# Patient Record
Sex: Female | Born: 1950 | Race: White | Hispanic: No | Marital: Married | Smoking: Never smoker
Health system: Southern US, Community
[De-identification: ages and names within clinical notes are randomized; demographics above are authoritative.]

---

## 2001-05-28 ENCOUNTER — Other Ambulatory Visit: Admission: RE | Admit: 2001-05-28 | Discharge: 2001-05-28 | Payer: Self-pay | Admitting: Obstetrics and Gynecology

## 2006-04-10 ENCOUNTER — Ambulatory Visit: Payer: Self-pay | Admitting: Internal Medicine

## 2006-04-10 ENCOUNTER — Inpatient Hospital Stay (HOSPITAL_COMMUNITY): Admission: EM | Admit: 2006-04-10 | Discharge: 2006-04-25 | Payer: Self-pay | Admitting: Emergency Medicine

## 2006-04-13 ENCOUNTER — Ambulatory Visit: Payer: Self-pay | Admitting: Thoracic Surgery

## 2006-04-20 ENCOUNTER — Ambulatory Visit: Payer: Self-pay | Admitting: Infectious Diseases

## 2006-05-03 ENCOUNTER — Ambulatory Visit: Payer: Self-pay | Admitting: Thoracic Surgery

## 2008-01-22 IMAGING — CR DG CHEST 1V PORT
1 series · 1 of 1 positions shown · non-contrast
Comparison: 04/16/06.

CLINICAL DATA: Dyspnea/hypoxia/follow-up pneumonia. 
 PORTABLE CHEST ? 1 VIEW:

[view not recorded]
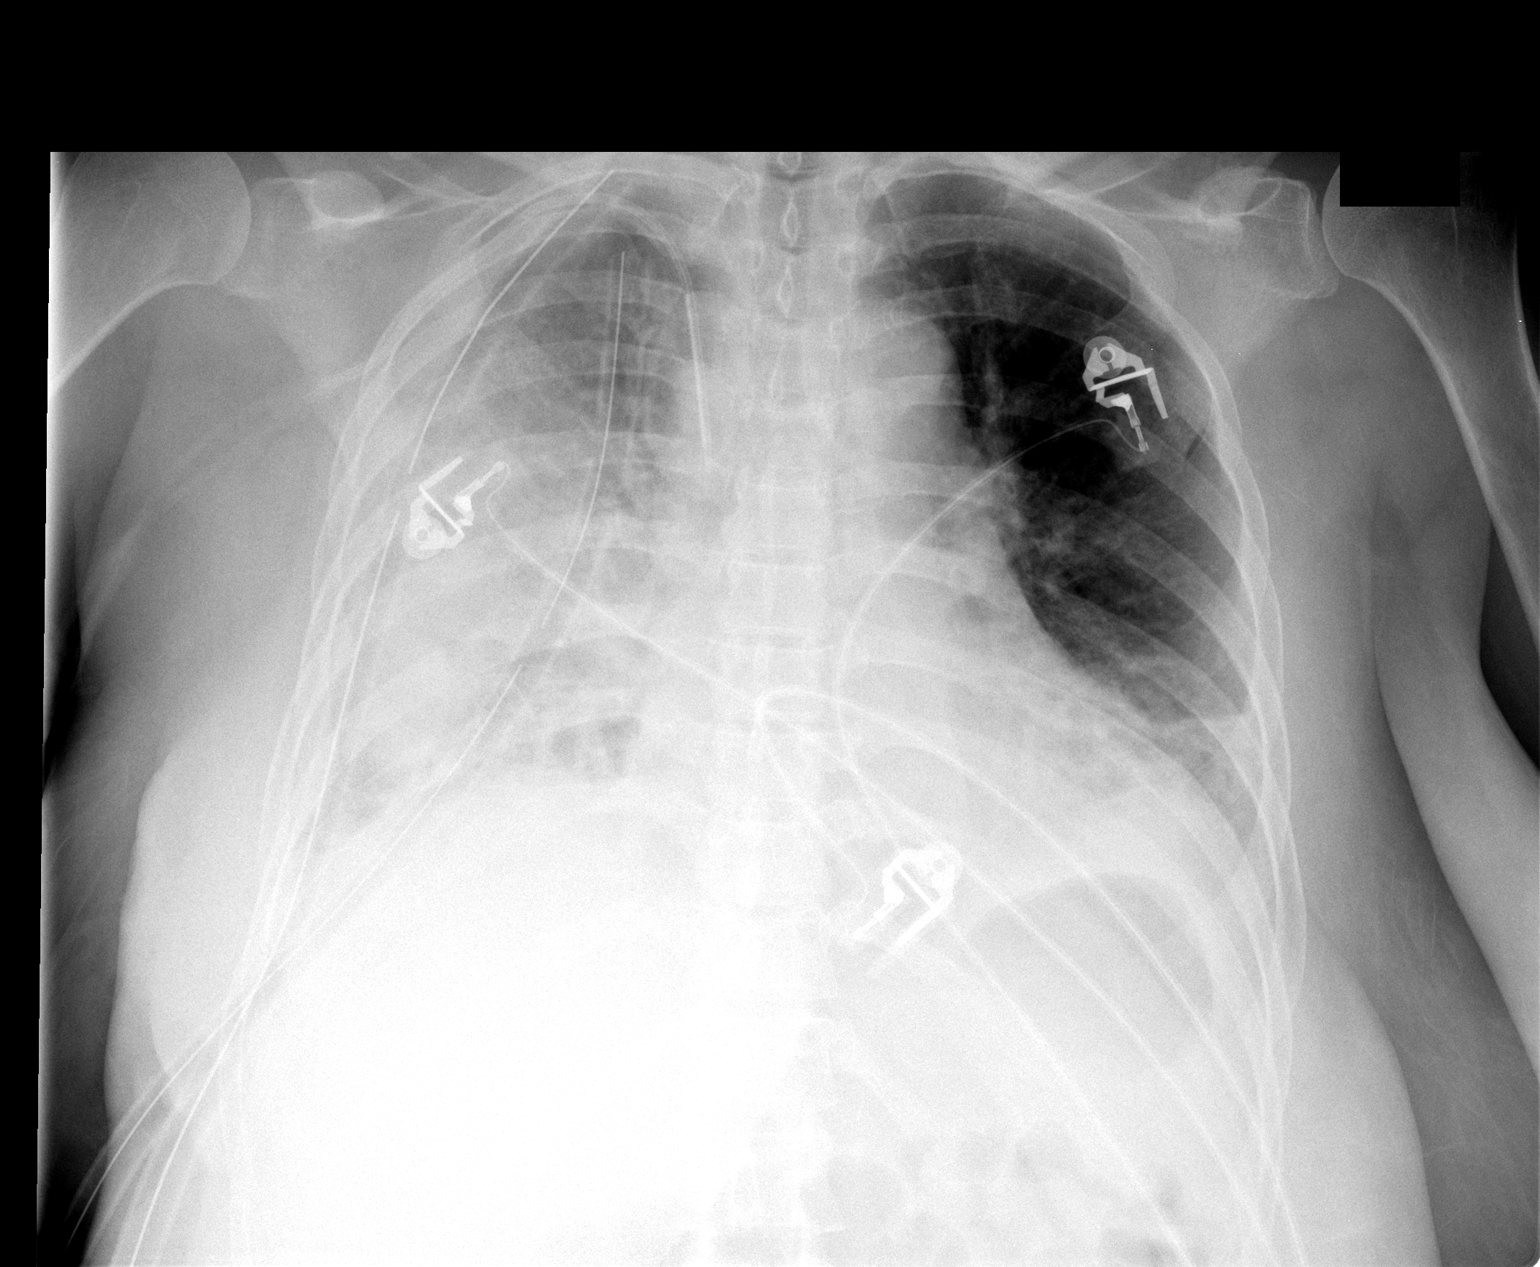

[1 of 1 positions shown; findings below may reference images not displayed]

FINDINGS: Interval worsening of bilateral air space disease.  There is relative sparing of the left upper lobe.  The right chest tubes remain in place as well as a central line with its tip in the right innominate vein.
IMPRESSION: Interval worsening of bilateral air space disease.

## 2008-01-23 IMAGING — CR DG CHEST 1V PORT
1 series · 1 of 1 positions shown · non-contrast
Comparison: 04/17/06.

CLINICAL DATA: Dyspnea. Hypoxia. Follow-up pneumonia. Status-post chest tube drainage with pleural effusion. 
 PORTABLE CHEST - 1 VIEW:

[view not recorded]
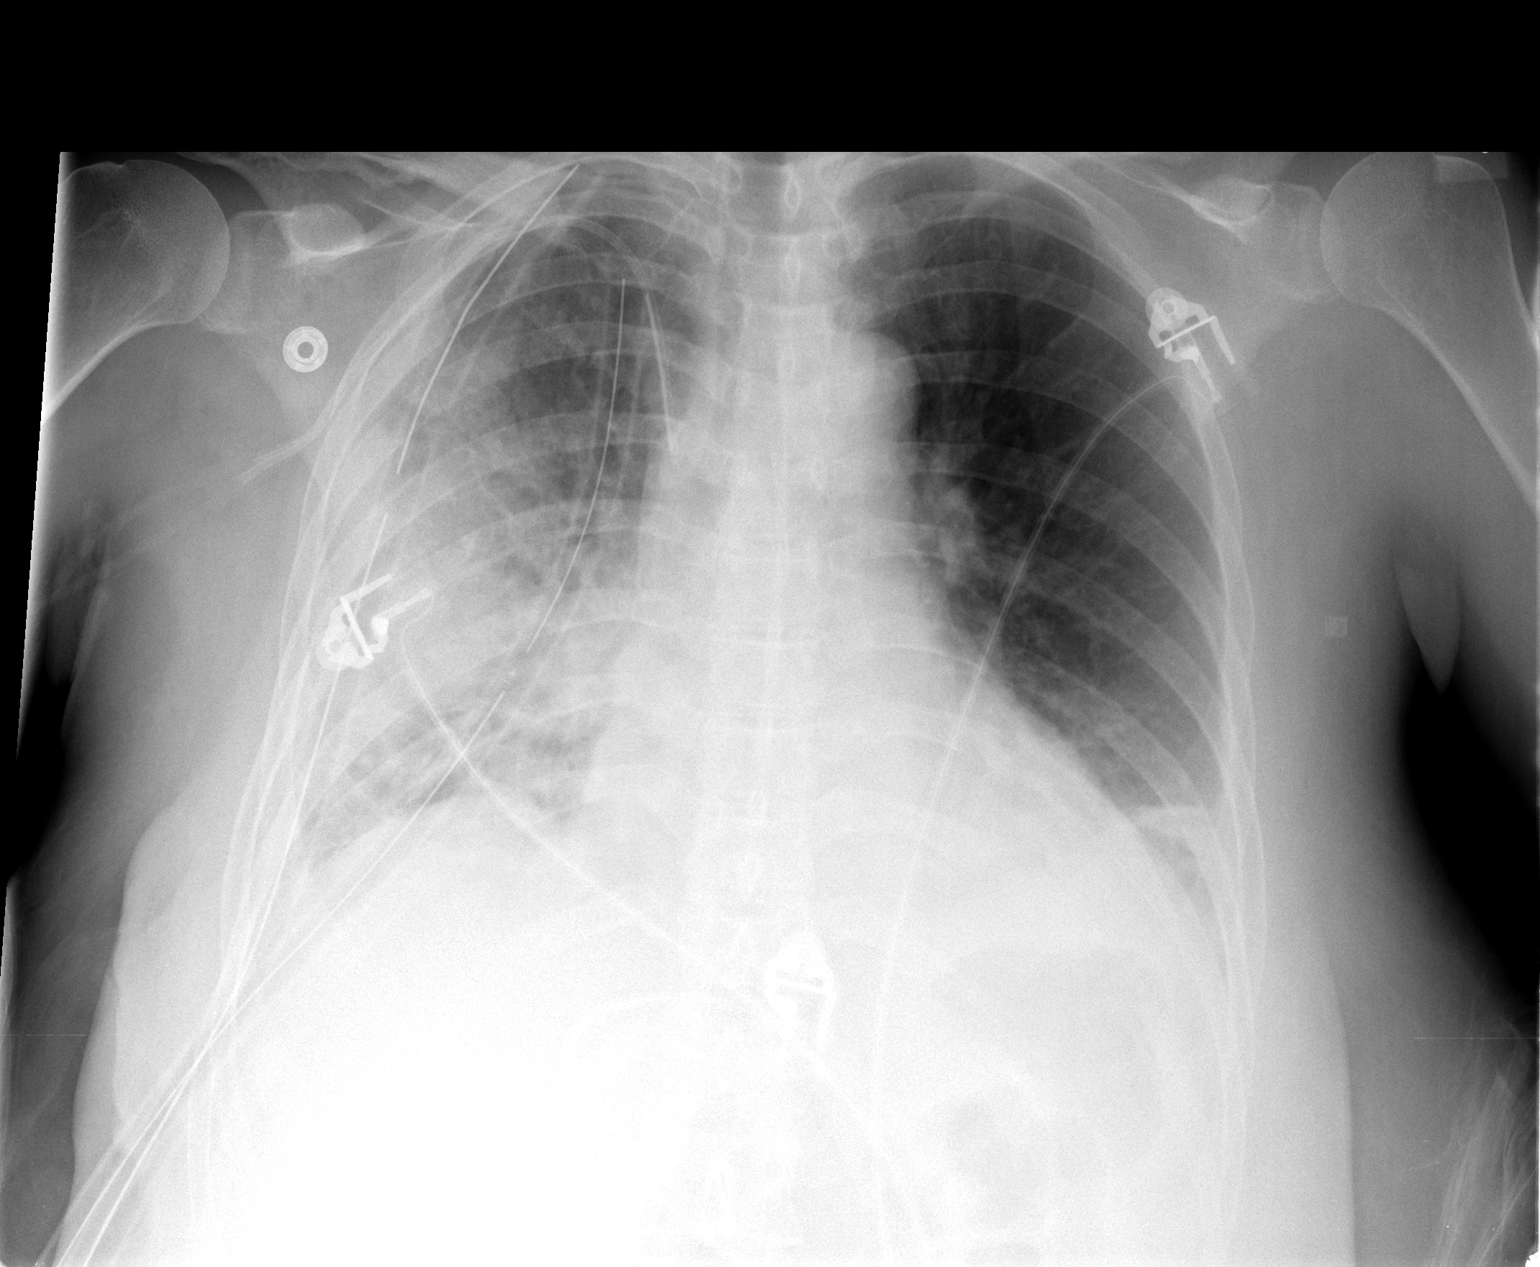

[1 of 1 positions shown; findings below may reference images not displayed]

FINDINGS: Two right chest tubes remain in place and there is no evidence of pneumothorax. 
 Diffuse right lung airspace disease is unchanged as well as mild infiltrate in the left lung base. Mild cardiomegaly is stable.
IMPRESSION: No significant change compared with prior study.

## 2008-01-24 IMAGING — CR DG CHEST 1V PORT
1 series · 1 of 1 positions shown · non-contrast
Comparison: 04/18/06.

CLINICAL DATA: Pneumonia, hypoxia, tachycardia.  Pneumothorax.  
PORTABLE CHEST - 1 VIEW 04/19/06:

[view not recorded]
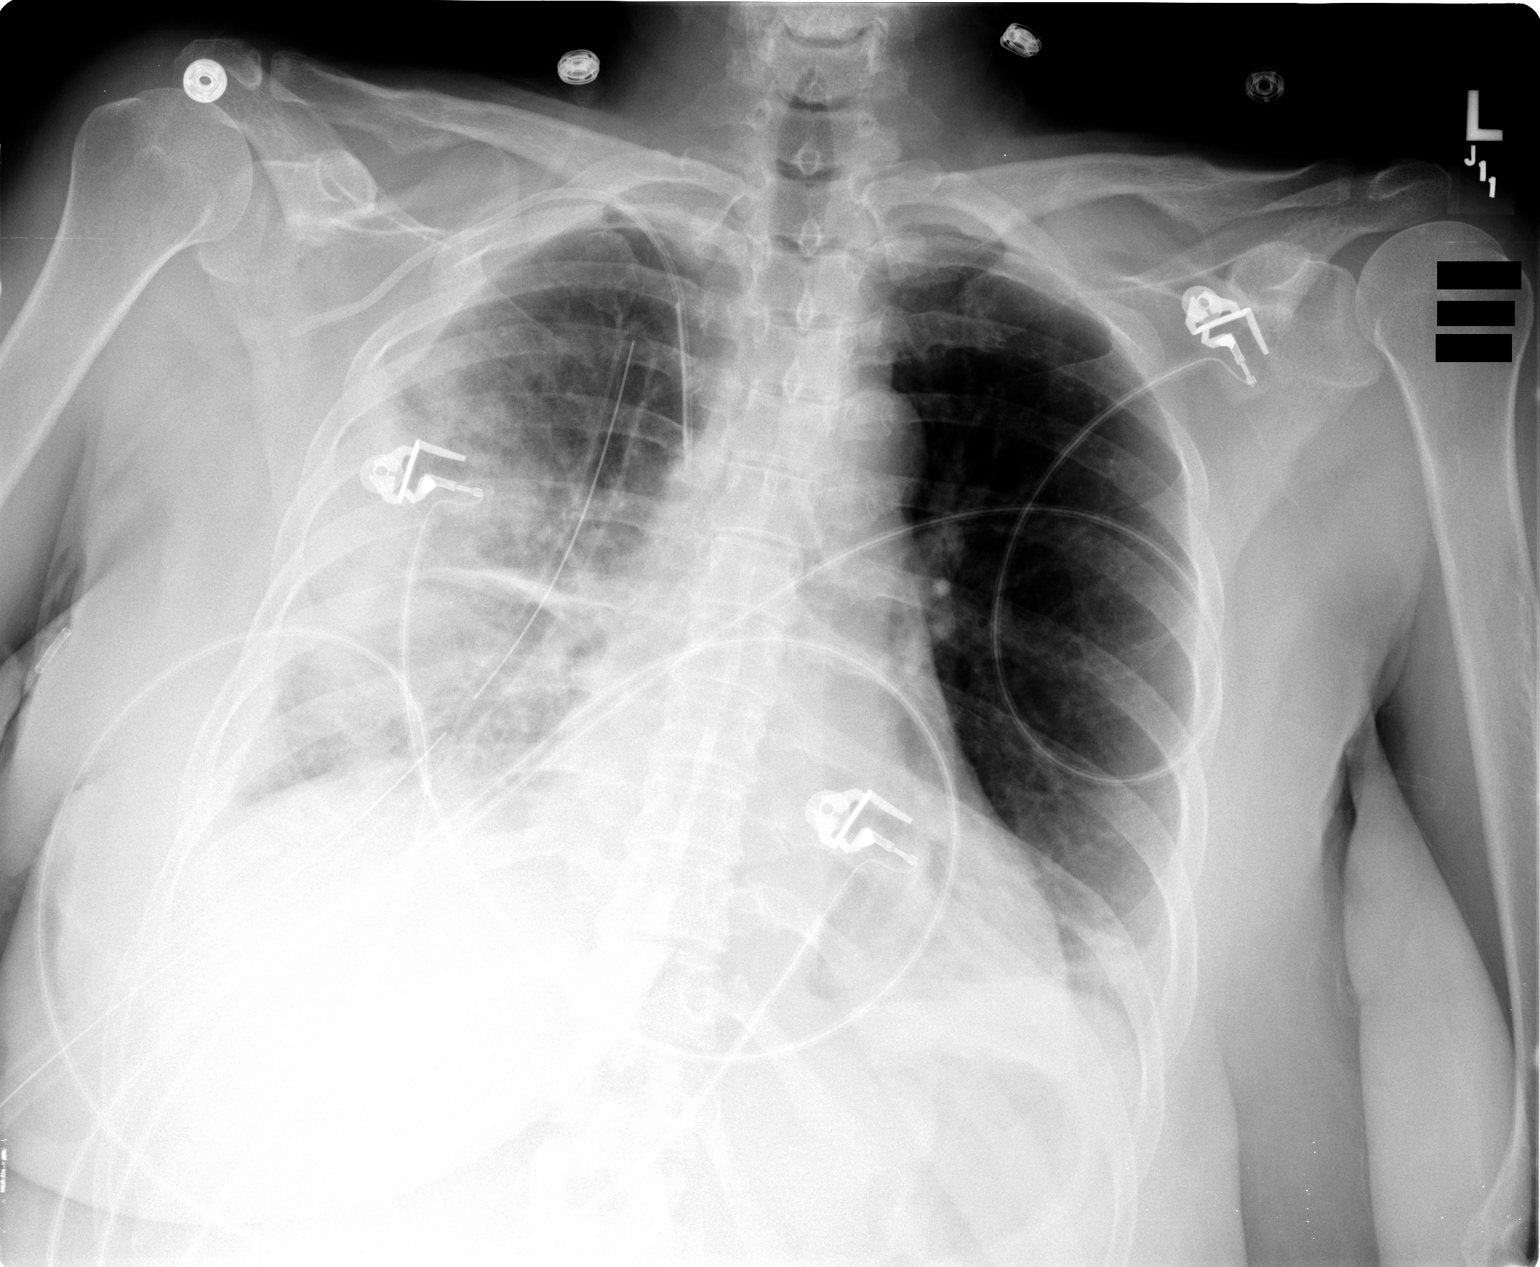

[1 of 1 positions shown; findings below may reference images not displayed]

FINDINGS: One of two right chest tubes has been removed in the interval.  No definite pleural air.  Right lung and left lower lobe airspace disease persists.  There is slight improvement in aeration in the right upper lobe.
IMPRESSION: 1.  No definite pneumothorax after removal of one of two right chest tubes.  
2.  Persistent right lung and left lower lobe airspace disease.  Right pleural effusion.

## 2008-01-25 IMAGING — US US RENAL
1 series · 14 of 25 positions shown · non-contrast
Comparison: none

CLINICAL DATA: Infection/inflammation.  Pneumonia.
 RENAL/URINARY TRACT ULTRASOUND:
TECHNIQUE: Complete ultrasound examination of the urinary tract was performed including evaluation of the kidneys, renal collecting systems, and urinary bladder.

[Series 1: unknown · 0.32mm/px · 14 of 30 slices shown]
[im 1/30]
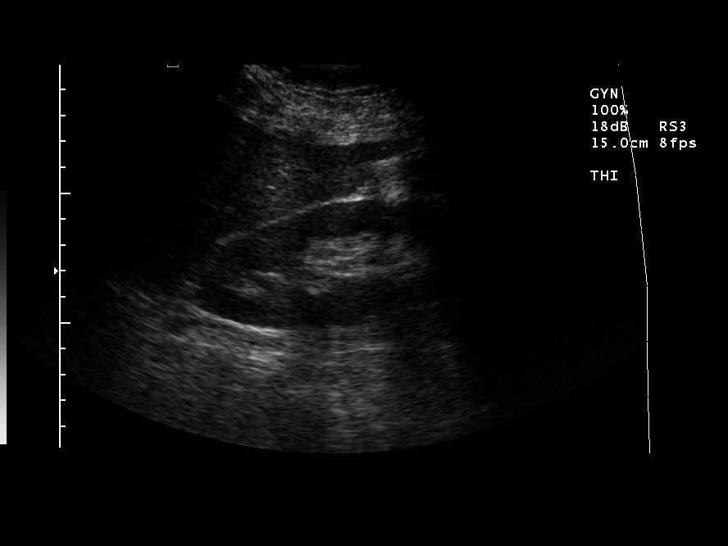
[im 3/30]
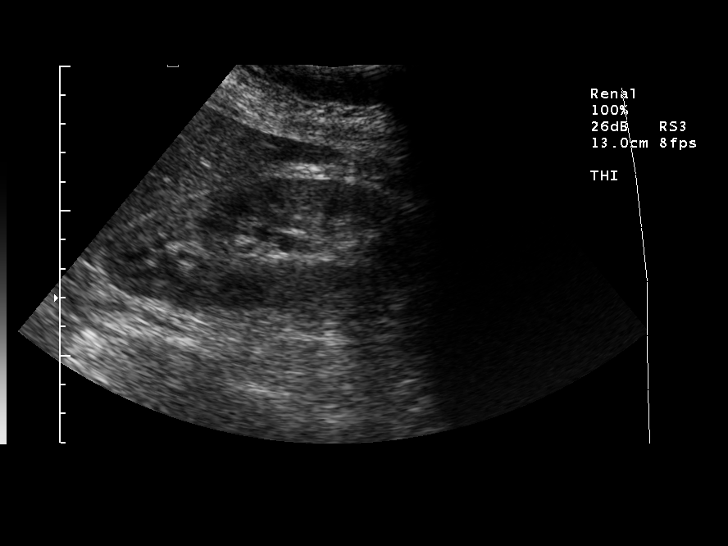
[im 5/30]
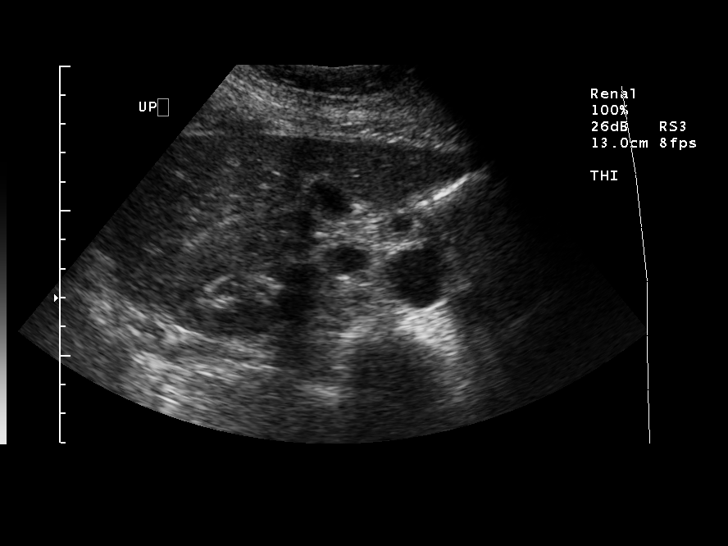
[im 8/30]
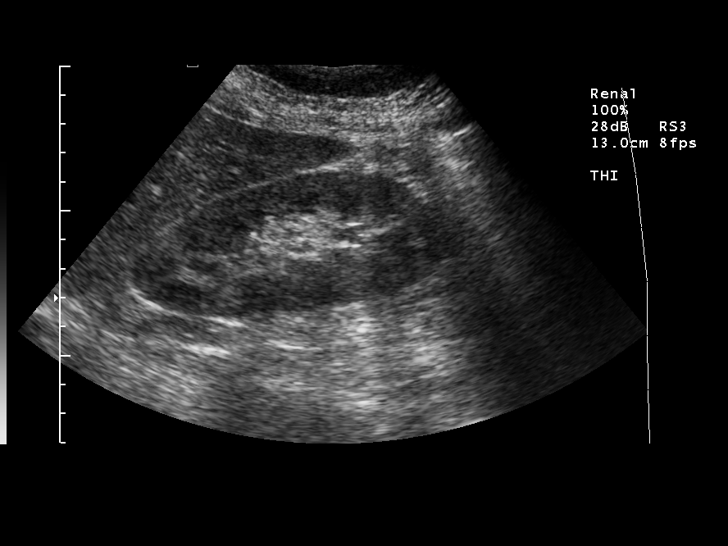
[im 10/30]
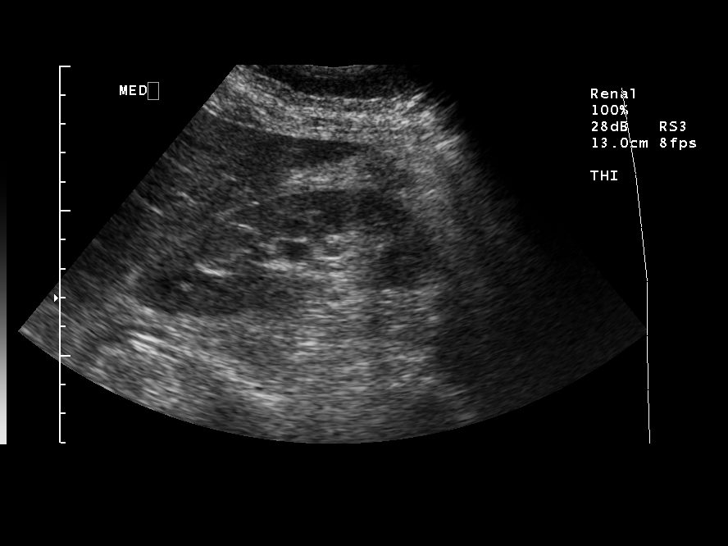
[im 11/30]
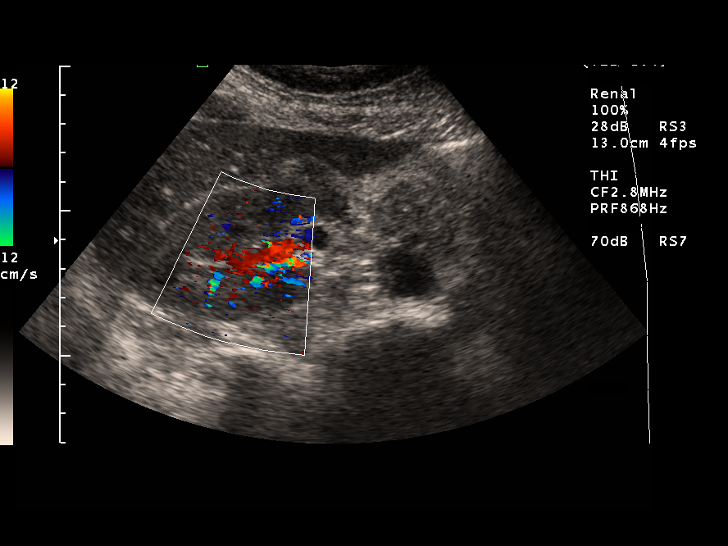
[im 14/30]
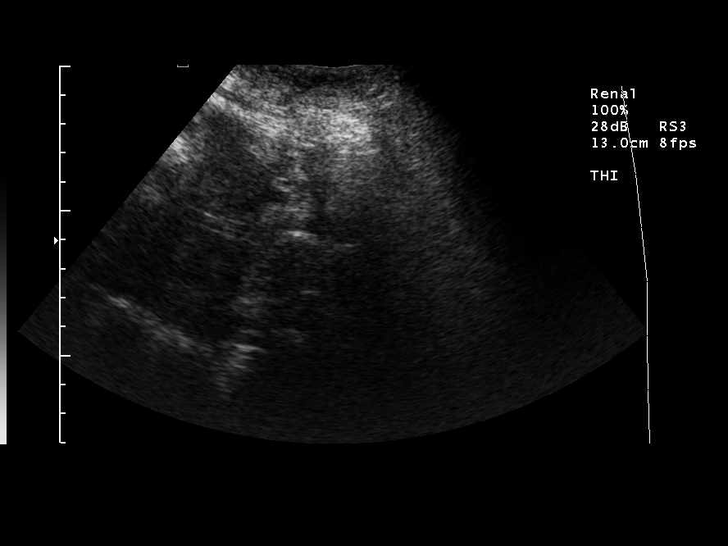
[im 16/30]
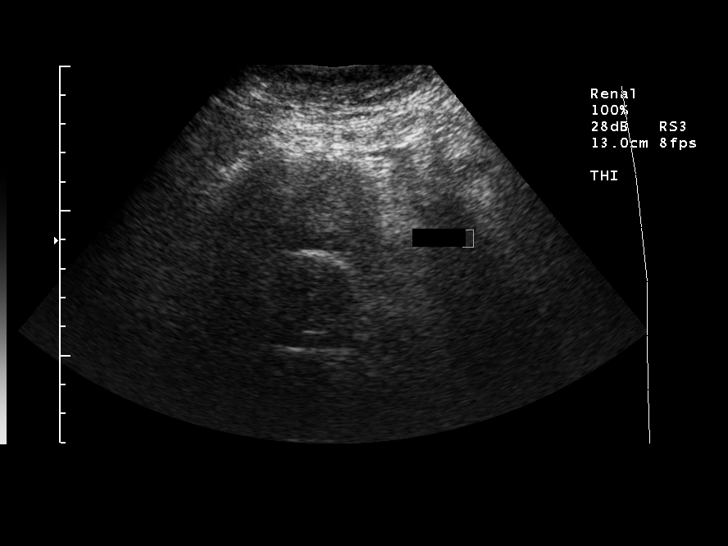
[im 19/30]
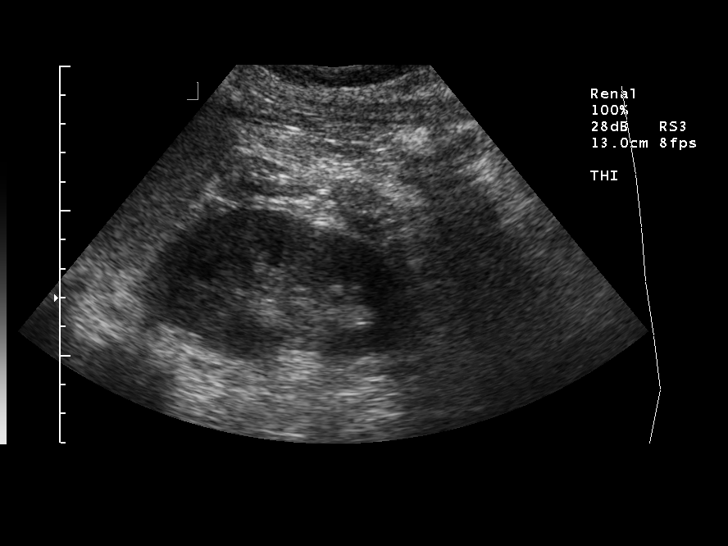
[im 20/30]
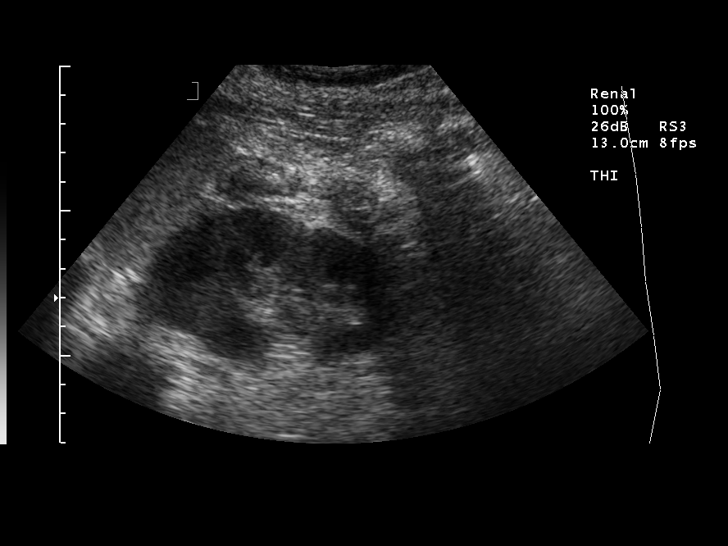
[im 22/30]
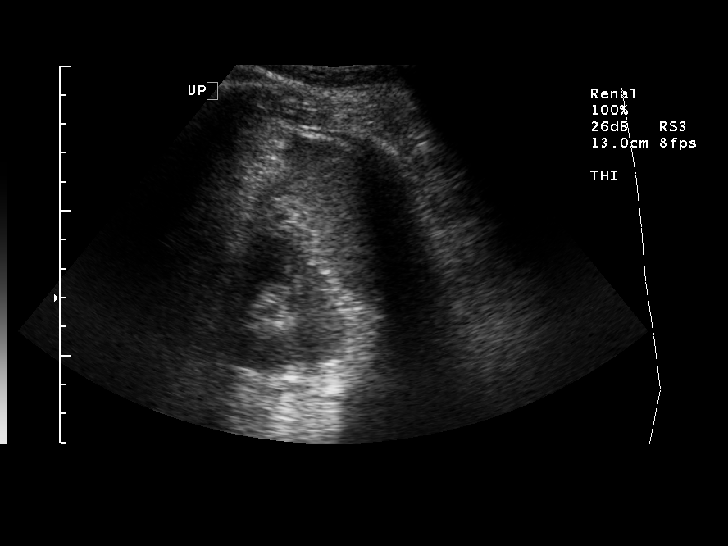
[im 25/30]
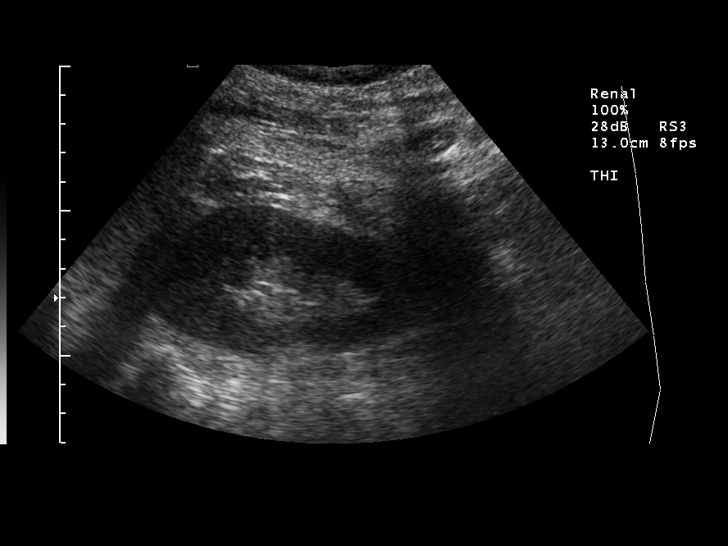
[im 27/30]
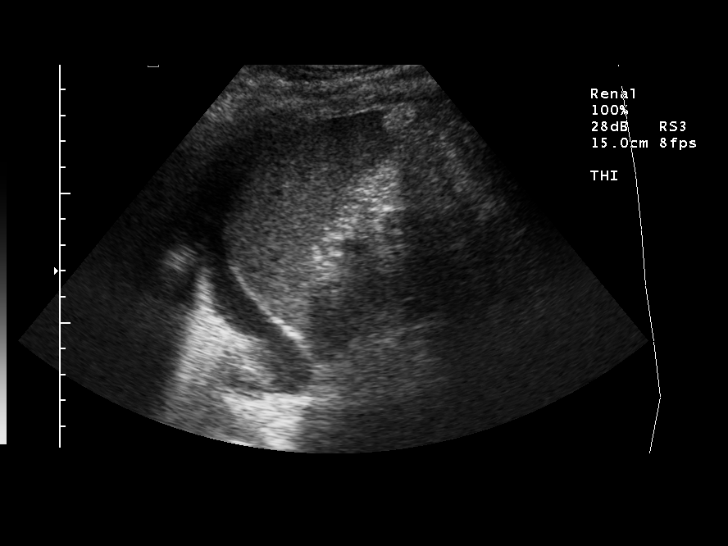
[im 30/30]
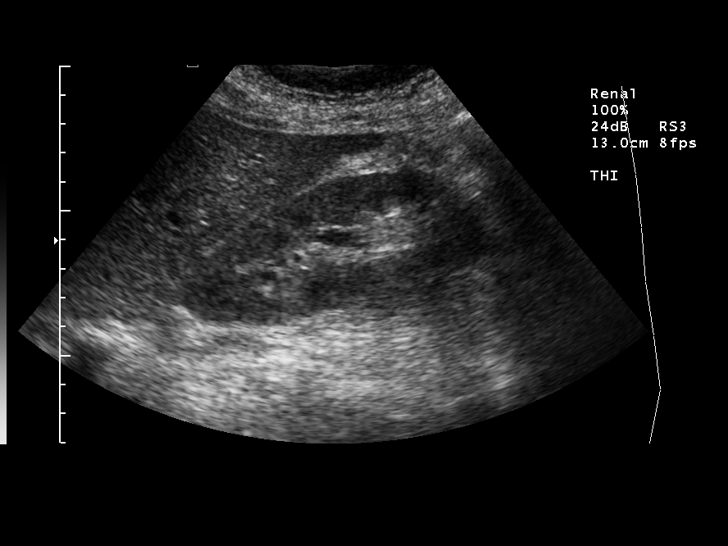

[14 of 25 positions shown; findings below may reference images not displayed]

FINDINGS: The right kidney measures 11.9 cm and appears normal.  Left kidney measures 10.7 cm and appears normal.  Urinary bladder is empty with a Foley catheter.  Small left pleural effusion is noted.
IMPRESSION: Negative renal ultrasound.

## 2008-01-28 IMAGING — CR DG CHEST 1V PORT
1 series · 1 of 1 positions shown · non-contrast
Comparison: none

HISTORY: Pneumonia

[view not recorded]
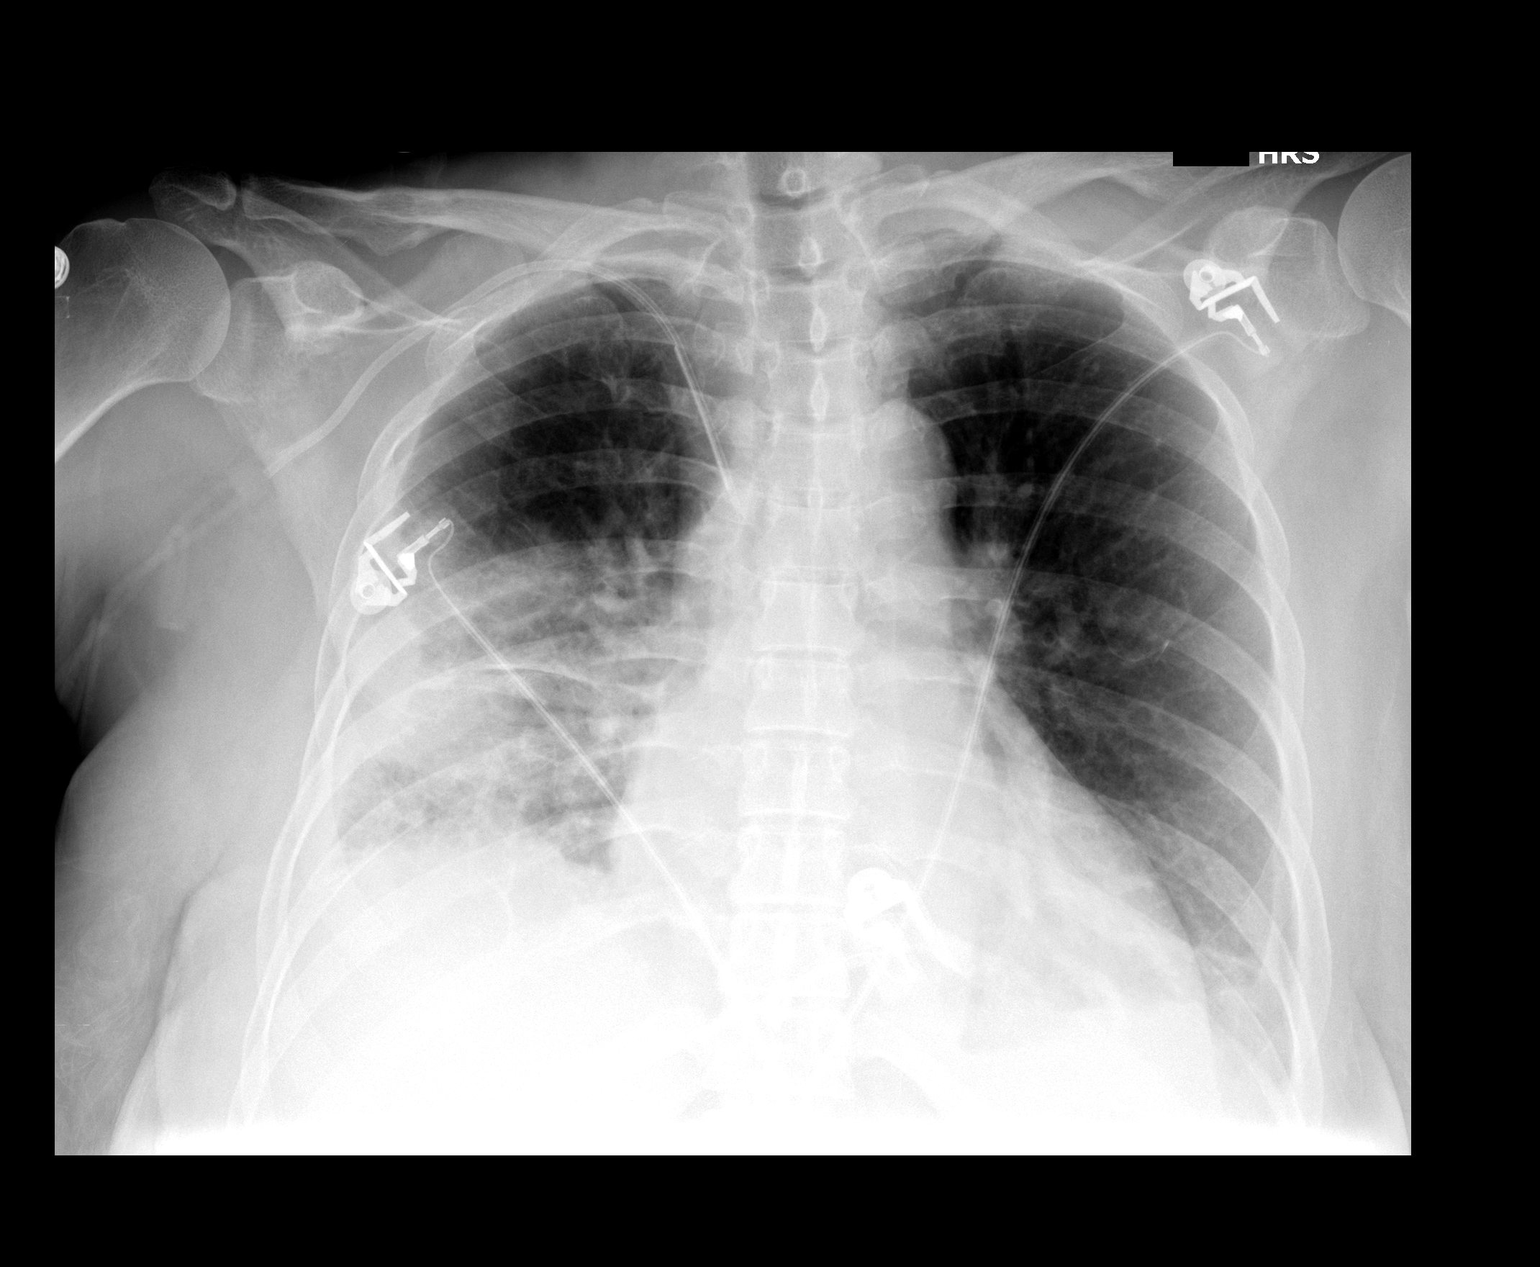

[1 of 1 positions shown; findings below may reference images not displayed]

PORTABLE CHEST ONE VIEW:

Portable exam 8818 hours compared to 04/22/2006

Left subclavian line stable, tip SVC.
Mild cardiac enlargement.
Mediastinal contours and pulmonary vascularity stable.
Persistent left base atelectasis.
Persistent atelectasis and infiltrate right lower lobe, unchanged.
Probable small right pleural effusion.
No pneumothorax.
IMPRESSION: No significant interval change.

## 2010-02-14 ENCOUNTER — Encounter: Payer: Self-pay | Admitting: Thoracic Surgery

## 2011-05-16 ENCOUNTER — Ambulatory Visit (INDEPENDENT_AMBULATORY_CARE_PROVIDER_SITE_OTHER): Payer: 59 | Admitting: Internal Medicine

## 2011-05-16 VITALS — BP 95/59 | HR 66 | Temp 98.0°F | Resp 16 | Ht 63.0 in | Wt 112.8 lb

## 2011-05-16 DIAGNOSIS — Z0000 Encounter for general adult medical examination without abnormal findings: Secondary | ICD-10-CM

## 2011-05-16 LAB — POCT CBC
Granulocyte percent: 35.2 %G — AB (ref 37–80)
HCT, POC: 42.3 % (ref 37.7–47.9)
Hemoglobin: 13.7 g/dL (ref 12.2–16.2)
MCHC: 32.4 g/dL (ref 31.8–35.4)
MCV: 88.9 fL (ref 80–97)
MID (cbc): 0.4 (ref 0–0.9)
POC Granulocyte: 1.3 — AB (ref 2–6.9)
POC LYMPH PERCENT: 55.3 %L — AB (ref 10–50)
POC MID %: 9.5 %M (ref 0–12)

## 2011-05-16 LAB — LIPID PANEL
LDL Cholesterol: 92 mg/dL (ref 0–99)
VLDL: 8 mg/dL (ref 0–40)

## 2011-05-16 LAB — POCT URINALYSIS DIPSTICK
Glucose, UA: NEGATIVE
Nitrite, UA: NEGATIVE
Urobilinogen, UA: 0.2

## 2011-05-16 LAB — COMPREHENSIVE METABOLIC PANEL
ALT: 18 U/L (ref 0–35)
AST: 25 U/L (ref 0–37)
Albumin: 4.9 g/dL (ref 3.5–5.2)
Alkaline Phosphatase: 59 U/L (ref 39–117)
Potassium: 4.3 mEq/L (ref 3.5–5.3)
Sodium: 143 mEq/L (ref 135–145)
Total Bilirubin: 0.7 mg/dL (ref 0.3–1.2)
Total Protein: 6.5 g/dL (ref 6.0–8.3)

## 2011-05-16 LAB — IFOBT (OCCULT BLOOD): IFOBT: POSITIVE

## 2011-05-16 NOTE — Progress Notes (Signed)
  Subjective:    Patient ID: Monica Parker, female    DOB: [birth year], 61 y.o.   MRN: [id number]  HPIAnnual exam Doing very well In the past year he has been discovered that her husband has Asperger's as do 2 of her 3 children This explains a lot of strange interactions over the years for her She continues her exploration of nutrition and has discovered that tomatoes cause her back pain and that she is sensitive to salicylates. 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   HENT: Negative.   Eyes: Negative.   Respiratory: Negative.   Cardiovascular: Negative.   Gastrointestinal: Negative.   Genitourinary: Negative.   Musculoskeletal: Negative.   Neurological: Negative.   Hematological: Negative.   Psychiatric/Behavioral: Negative.        Objective:   Physical Exam  Constitutional: She is oriented to person, place, and time. She appears well-developed and well-nourished.  HENT:  Head: Normocephalic.  Right Ear: External ear normal.  Left Ear: External ear normal.  Nose: Nose normal.  Eyes: Conjunctivae and EOM are normal. Pupils are equal, round, and reactive to light.  Cardiovascular: Normal rate, regular rhythm and normal heart sounds.  Exam reveals no gallop and no friction rub.   No murmur heard. Pulmonary/Chest: Effort normal and breath sounds normal. She has no wheezes. She has no rales. She exhibits no tenderness.  Abdominal: Soft. Bowel sounds are normal. She exhibits no mass.  Genitourinary: Vagina normal and uterus normal. Guaiac negative stool. No vaginal discharge found.       os small nontender/ Not friable/ no adnexal masses  Neurological: She is alert and oriented to person, place, and time. She has normal reflexes. No cranial nerve deficit.  Psychiatric: She has a  normal mood and affect. Her behavior is normal. Judgment and thought content normal.          Assessment & Plan:  Annual exam in good health Routine labs plus vitamin D her request Declines Tdap and colonoscopy and mammography

## 2011-05-17 LAB — VITAMIN D 25 HYDROXY (VIT D DEFICIENCY, FRACTURES): Vit D, 25-Hydroxy: 44 ng/mL (ref 30–89)

## 2011-05-18 LAB — PAP IG (IMAGE GUIDED)

## 2011-05-27 ENCOUNTER — Telehealth: Payer: Self-pay

## 2011-05-27 NOTE — Telephone Encounter (Signed)
Pt is requesting a phone from lab to give her results cbc and other labs.Marland Kitchen

## 2011-05-30 ENCOUNTER — Encounter: Payer: Self-pay | Admitting: Internal Medicine

## 2011-05-30 NOTE — Telephone Encounter (Signed)
Actually tell her everything is normal/my fault for not sending these to her/I had the lab mail them today

## 2011-05-30 NOTE — Telephone Encounter (Signed)
Dr. Merla Riches,  Please review labs/advise and I will contact patient.

## 2011-05-31 NOTE — Telephone Encounter (Signed)
Pt CB and requested a copy of her labs that she could p/up to get more quickly. Needed  for school. Copied and left in drawer.

## 2011-05-31 NOTE — Telephone Encounter (Signed)
LMOM with normal lab results and that copy is being mailed to her. Asked for CB w/any further ?s

## 2013-05-07 ENCOUNTER — Other Ambulatory Visit: Payer: Self-pay | Admitting: Internal Medicine

## 2013-05-07 ENCOUNTER — Ambulatory Visit (INDEPENDENT_AMBULATORY_CARE_PROVIDER_SITE_OTHER): Payer: 59 | Admitting: Internal Medicine

## 2013-05-07 VITALS — BP 112/66 | HR 60 | Temp 97.4°F | Resp 12 | Ht 64.0 in | Wt 113.0 lb

## 2013-05-07 DIAGNOSIS — Z0000 Encounter for general adult medical examination without abnormal findings: Secondary | ICD-10-CM

## 2013-05-07 LAB — POCT CBC
Granulocyte percent: 39 %G (ref 37–80)
HCT, POC: 43.8 % (ref 37.7–47.9)
Hemoglobin: 13.8 g/dL (ref 12.2–16.2)
Lymph, poc: 1.8 (ref 0.6–3.4)
MCH: 29.7 pg (ref 27–31.2)
MCHC: 31.5 g/dL — AB (ref 31.8–35.4)
MCV: 94.3 fL (ref 80–97)
MID (CBC): 0.3 (ref 0–0.9)
MPV: 8.4 fL (ref 0–99.8)
PLATELET COUNT, POC: 151 10*3/uL (ref 142–424)
POC Granulocyte: 1.3 — AB (ref 2–6.9)
POC LYMPH PERCENT: 53.3 %L — AB (ref 10–50)
POC MID %: 7.7 % (ref 0–12)
RBC: 4.65 M/uL (ref 4.04–5.48)
RDW, POC: 15.5 %
WBC: 3.4 10*3/uL — AB (ref 4.6–10.2)

## 2013-05-07 LAB — COMPREHENSIVE METABOLIC PANEL
ALBUMIN: 4.4 g/dL (ref 3.5–5.2)
ALT: 42 U/L — AB (ref 0–35)
AST: 48 U/L — AB (ref 0–37)
Alkaline Phosphatase: 62 U/L (ref 39–117)
BUN: 21 mg/dL (ref 6–23)
CO2: 32 mEq/L (ref 19–32)
Calcium: 9 mg/dL (ref 8.4–10.5)
Chloride: 102 mEq/L (ref 96–112)
Creat: 0.82 mg/dL (ref 0.50–1.10)
Glucose, Bld: 90 mg/dL (ref 70–99)
POTASSIUM: 4.5 meq/L (ref 3.5–5.3)
SODIUM: 139 meq/L (ref 135–145)
Total Bilirubin: 0.6 mg/dL (ref 0.2–1.2)
Total Protein: 6.3 g/dL (ref 6.0–8.3)

## 2013-05-07 LAB — LIPID PANEL
Cholesterol: 160 mg/dL (ref 0–200)
HDL: 82 mg/dL (ref >39)
LDL CALC: 70 mg/dL (ref 0–99)
Total CHOL/HDL Ratio: 2 Ratio
Triglycerides: 41 mg/dL (ref <150)
VLDL: 8 mg/dL (ref 0–40)

## 2013-05-07 LAB — IFOBT (OCCULT BLOOD): IFOBT: NEGATIVE

## 2013-05-07 NOTE — Progress Notes (Addendum)
This chart was scribed for Monica Siaobert Marli Diego, MD by Monica Parker, ED Scribe. This patient was seen in room Room/bed 4 and the patient's care was started at 12:40 PM. Subjective:    Patient ID: Monica Parker, female    DOB: Nov 17, 1950, 63 y.o.   MRN: [id number] Chief Complaint  Patient presents with  . Annual Exam   HPI Monica Parker is a 63 y.o. female who [facility]. 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 She states she reads about 80 scientific articles a day and states this is what she loves to do. 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 Pt states her husband and his whole family has Asperger's Disease. She states he does not talk and finds him parroting her. Pt states she has been healthy the past 2 years and denies having any problems/ complications. She states she has "tons of energy". Pt states she is staying busy with everything she is involved. Pt denies wanting to have a mammogram and coloscopy screening. She denies having SOB, heart burn, bowel related issues, flexibility issues, and appetite changes.  Pt states she was having slight swelling to her lower extremities but states she began taking Thymin and denies having any further problems.   She is requesting blood work to be done although her blood work was WNL.  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  Respiratory: Negative for shortness of breath.   All other systems reviewed and are negative.  Objective:   Physical Exam  Nursing note and vitals reviewed. Constitutional: She is oriented to person, place, and time. She appears  well-developed and well-nourished. No distress.  HENT:  Head: Normocephalic.  Right Ear: External ear normal.  Left Ear: External ear normal.  Eyes: EOM are normal. Pupils are equal, round, and reactive to light.  Neck: Normal range of motion. Neck supple. No thyromegaly present.  Cardiovascular: Normal rate, regular rhythm and normal heart sounds.   No murmur heard. Pulmonary/Chest: Effort normal and breath sounds normal. She has no rales.  Abdominal: Soft.  Genitourinary:  Small pea sized nodule to top of RUOQ breast. That is not actually breast tissue.  WU:[id number]:Introitus clear. Small os. Uterus mid-position non-tender, not swollen. No adnexal masses or rectal masses. Hema test done.  Musculoskeletal: She exhibits no edema.  Lymphadenopathy:    She has no cervical adenopathy.  Neurological: She is alert and oriented to person, place, and time.  Skin: Skin is warm. No rash noted.  Psychiatric: She has a normal mood and affect. Her behavior is normal. Thought content normal.    Triage Vitals:BP 112/66  Pulse 60  Temp(Src) 97.4 F (36.3 C) (Oral)  Resp 12  Ht 5\' 4"  (1.626 m)  Wt 113 lb (51.256 kg)  BMI 19.39 kg/m2 Assessment & Plan:    I have completed the patient encounter in its entirety as documented by the scribe, with editing by me where necessary. Monica Parker, M.D.  Routine general medical examination at a health care facility - Plan: Pap IG and HPV (high risk) DNA detection, POCT CBC, IFOBT POC (occult bld, rslt in office), Vit D  25 hydroxy (rtn osteoporosis monitoring), Lipid panel, Comprehensive metabolic panel, Hepatitis B surface antibody, Measles/Mumps/Rubella Immunity, Varicella zoster antibody, IgG  Notify results  Addend 4/16 Results for orders placed in visit on 05/07/13  VITAMIN D 25 HYDROXY      Result Value Ref Range   Vit D, 25-Hydroxy 40  30 - 89 ng/mL  LIPID PANEL      Result Value Ref Range   Cholesterol 160  0 - 200 mg/dL   Triglycerides 41   <161<150 mg/dL   HDL 82  >09>39 mg/dL   Total CHOL/HDL Ratio 2.0     VLDL 8  0 - 40 mg/dL   LDL Cholesterol 70  0 - 99 mg/dL  COMPREHENSIVE METABOLIC PANEL      Result Value Ref Range   Sodium 139  135 - 145 mEq/L   Potassium 4.5  3.5 - 5.3 mEq/L   Chloride 102  96 - 112 mEq/L   CO2 32  19 - 32 mEq/L   Glucose, Bld 90  70 - 99 mg/dL   BUN 21  6 - 23 mg/dL   Creat 6.040.82  5.400.50 - 9.811.10 mg/dL   Total Bilirubin 0.6  0.2 - 1.2 mg/dL   Alkaline Phosphatase 62  39 - 117 U/L   AST 48 (*) 0 - 37 U/L   ALT 42 (*) 0 - 35 U/L   Total Protein 6.3  6.0 - 8.3 g/dL   Albumin 4.4  3.5 - 5.2 g/dL   Calcium 9.0  8.4 - 19.110.5 mg/dL  HEPATITIS B SURFACE ANTIBODY, QUANTITATIVE      Result Value Ref Range   Hepatitis B-Post 0.0    MEASLES/MUMPS/RUBELLA IMMUNITY      Result Value Ref Range   Rubella 18.90 (*) <0.90 Index   Mumps IgG 71.50 (*) <9.00 AU/mL   Rubeola IgG >300.00 (*) <25.00 AU/mL  VARICELLA ZOSTER ANTIBODY, IGG      Result Value Ref Range   Varicella IgG 1920.00 (*) <135.00 Index  POCT CBC      Result Value Ref Range   WBC 3.4 (*) 4.6 - 10.2 K/uL   Lymph, poc 1.8  0.6 - 3.4   POC LYMPH PERCENT 53.3 (*) 10 - 50 %L   MID (cbc) 0.3  0 - 0.9   POC MID % 7.7  0 - 12 %M   POC Granulocyte 1.3 (*) 2 - 6.9   Granulocyte percent 39.0  37 - 80 %G   RBC 4.65  4.04 - 5.48 M/uL   Hemoglobin 13.8  12.2 - 16.2 g/dL   HCT, POC 47.843.8  29.537.7 - 47.9 %   MCV 94.3  80 - 97 fL   MCH, POC 29.7  27 - 31.2 pg   MCHC 31.5 (*) 31.8 - 35.4 g/dL   RDW, POC 62.115.5     Platelet Count, POC 151  142 - 424 K/uL   MPV 8.4  0 - 99.8 fL  IFOBT (OCCULT BLOOD)      Result Value Ref Range   IFOBT Negative    PAP IG AND HPV HIGH-RISK      Result Value Ref Range   HPV DNA High Risk Not Detected     Specimen adequacy: SEE NOTE     FINAL DIAGNOSIS: SEE NOTE     COMMENTS: SEE NOTE     Cytotechnologist: SEE NOTE

## 2013-05-08 LAB — HEPATITIS B SURFACE ANTIBODY, QUANTITATIVE: Hepatitis B-Post: 0 m[IU]/mL

## 2013-05-08 LAB — VARICELLA ZOSTER ANTIBODY, IGG: Varicella IgG: 1920 Index — ABNORMAL HIGH (ref <135.00)

## 2013-05-08 LAB — VITAMIN D 25 HYDROXY (VIT D DEFICIENCY, FRACTURES): Vit D, 25-Hydroxy: 40 ng/mL (ref 30–89)

## 2013-05-08 LAB — MEASLES/MUMPS/RUBELLA IMMUNITY
Mumps IgG: 71.5 AU/mL — ABNORMAL HIGH (ref <9.00)
Rubella: 18.9 Index — ABNORMAL HIGH (ref <0.90)
Rubeola IgG: >300 AU/mL — ABNORMAL HIGH (ref <25.00)

## 2013-05-09 LAB — PAP IG AND HPV HIGH-RISK: HPV DNA High Risk: NOT DETECTED

## 2013-05-10 LAB — HEPATITIS C ANTIBODY: HCV AB: NEGATIVE

## 2013-05-12 ENCOUNTER — Encounter: Payer: Self-pay | Admitting: Internal Medicine

## 2013-06-11 ENCOUNTER — Telehealth: Payer: Self-pay

## 2013-06-11 NOTE — Telephone Encounter (Signed)
Forms will be left at nurses station

## 2013-06-11 NOTE — Telephone Encounter (Signed)
DOOL[place]LE - Pt dropped off form to be signed by you.  She had her physical with you in April.  Call [phone] when ready to pick up

## 2013-06-12 ENCOUNTER — Encounter: Payer: Self-pay | Admitting: Internal Medicine

## 2013-06-12 NOTE — Telephone Encounter (Signed)
Placed this form in Dr. Netta Corriganoolittle's box.

## 2013-08-29 ENCOUNTER — Encounter: Payer: Self-pay | Admitting: Internal Medicine

## 2013-09-03 NOTE — Telephone Encounter (Signed)
Patient called regarding letter that she needs. Please return call at [phone]

## 2013-09-06 ENCOUNTER — Other Ambulatory Visit: Payer: Self-pay | Admitting: Internal Medicine

## 2013-09-06 ENCOUNTER — Telehealth: Payer: Self-pay

## 2013-09-06 ENCOUNTER — Encounter: Payer: Self-pay | Admitting: Internal Medicine

## 2013-09-06 NOTE — Telephone Encounter (Signed)
Patient called back requesting the status on the letter she was requesting to attend orientation. Patient states she needs it for Monday to beable to attend it. Please call patient at [phone] If possible [facility] #[phone]

## 2013-09-06 NOTE — Telephone Encounter (Signed)
Call-letter faxed 6pm  Does she want to come get a copy???

## 2013-09-06 NOTE — Telephone Encounter (Signed)
Pt email in the system regarding letter. Please advise if there is something you need from us to complete this letter.

## 2013-09-06 NOTE — Telephone Encounter (Signed)
Pt is calling back to check on the status of the medical exemption letter that she requested from our office. She has been communicating with dr. Merla Richesoolittle via [facility] and sent him the fax number of where the letter needs to go but that office has not received anything from us. Can we please update the pt?

## 2013-09-07 NOTE — Telephone Encounter (Signed)
Tried to call pt. Left message on machine to call back on cell and when I tried to call home number, it disconnected.

## 2013-09-08 NOTE — Telephone Encounter (Signed)
lmom to let her know we have a copy of letter here for pickup

## 2013-09-11 ENCOUNTER — Telehealth: Payer: Self-pay

## 2013-09-11 NOTE — Telephone Encounter (Signed)
Pt states dr Merla Richesdoolittle had written a letter for her through [facility] and it is some type poe's that incorrect, would like to speak with him about what need correcting Please call [phone]

## 2013-09-12 ENCOUNTER — Encounter: Payer: Self-pay | Admitting: Internal Medicine

## 2013-09-12 NOTE — Telephone Encounter (Signed)
Lm for rtn call 

## 2013-09-12 NOTE — Telephone Encounter (Signed)
LM for pt to contact us with what changes need to be made through MyChart.

## 2013-09-16 NOTE — Telephone Encounter (Signed)
Faxed revised letter w/confirmation. Notified pt on MyChart.

## 2014-12-22 ENCOUNTER — Encounter: Payer: Self-pay | Admitting: Internal Medicine
